# Patient Record
Sex: Male | Born: 1990 | Race: White | Hispanic: No | Marital: Single
Health system: Southern US, Community
[De-identification: ages and names within clinical notes are randomized; demographics above are authoritative.]

## PROBLEM LIST (undated history)

## (undated) DIAGNOSIS — J45909 Unspecified asthma, uncomplicated: Secondary | ICD-10-CM

## (undated) HISTORY — PX: CHOLECYSTECTOMY: SHX55

---

## 2015-02-04 ENCOUNTER — Emergency Department (HOSPITAL_BASED_OUTPATIENT_CLINIC_OR_DEPARTMENT_OTHER)
Admission: EM | Admit: 2015-02-04 | Discharge: 2015-02-04 | Disposition: A | Discharge status: Home / Self Care | Payer: Self-pay | Attending: Emergency Medicine | Admitting: Emergency Medicine

## 2015-02-04 ENCOUNTER — Encounter (HOSPITAL_BASED_OUTPATIENT_CLINIC_OR_DEPARTMENT_OTHER): Payer: Self-pay | Admitting: *Deleted

## 2015-02-04 DIAGNOSIS — K0889 Other specified disorders of teeth and supporting structures: Secondary | ICD-10-CM

## 2015-02-04 DIAGNOSIS — K088 Other specified disorders of teeth and supporting structures: Secondary | ICD-10-CM | POA: Insufficient documentation

## 2015-02-04 DIAGNOSIS — K029 Dental caries, unspecified: Secondary | ICD-10-CM | POA: Insufficient documentation

## 2015-02-04 DIAGNOSIS — J45909 Unspecified asthma, uncomplicated: Secondary | ICD-10-CM | POA: Insufficient documentation

## 2015-02-04 HISTORY — DX: Unspecified asthma, uncomplicated: J45.909

## 2015-02-04 MED ORDER — IBUPROFEN 600 MG PO TABS: 600.0000 mg | ORAL_TABLET | Freq: Four times a day (QID) | ORAL | Status: AC | PRN

## 2015-02-04 MED ORDER — IBUPROFEN 200 MG PO TABS
600.0000 mg | ORAL_TABLET | Freq: Once | ORAL | Status: AC
  Administered 2015-02-04: 600 mg via ORAL
  Filled 2015-02-04 (×2): qty 1

## 2015-02-04 MED ORDER — PENICILLIN V POTASSIUM 500 MG PO TABS: 500.0000 mg | ORAL_TABLET | Freq: Four times a day (QID) | ORAL | Status: AC

## 2015-02-04 NOTE — ED Notes (Signed)
MD at bedside. 

## 2015-02-04 NOTE — Discharge Instructions (Signed)
Dental Pain °A tooth ache may be caused by cavities (tooth decay). Cavities expose the nerve of the tooth to air and hot or cold temperatures. It may come from an infection or abscess (also called a boil or furuncle) around your tooth. It is also often caused by dental caries (tooth decay). This causes the pain you are having. °DIAGNOSIS  °Your caregiver can diagnose this problem by exam. °TREATMENT  °· If caused by an infection, it may be treated with medications which kill germs (antibiotics) and pain medications as prescribed by your caregiver. Take medications as directed. °· Only take over-the-counter or prescription medicines for pain, discomfort, or fever as directed by your caregiver. °· Whether the tooth ache today is caused by infection or dental disease, you should see your dentist as soon as possible for further care. °SEEK MEDICAL CARE IF: °The exam and treatment you received today has been provided on an emergency basis only. This is not a substitute for complete medical or dental care. If your problem worsens or new problems (symptoms) appear, and you are unable to meet with your dentist, call or return to this location. °SEEK IMMEDIATE MEDICAL CARE IF:  °· You have a fever. °· You develop redness and swelling of your face, jaw, or neck. °· You are unable to open your mouth. °· You have severe pain uncontrolled by pain medicine. °MAKE SURE YOU:  °· Understand these instructions. °· Will watch your condition. °· Will get help right away if you are not doing well or get worse. °Document Released: 07/09/2005 Document Revised: 10/01/2011 Document Reviewed: 02/25/2008 °ExitCare® Patient Information ©2015 [employer]. This information is not intended to replace advice given to you by your health care provider. Make sure you discuss any questions you have with your health care provider. ° °Emergency Department Resource Guide °1) Find a Doctor and Pay Out of Pocket °Although you won't have to find out who  is covered by your insurance plan, it is a good idea to ask around and get recommendations. You will then need to call the office and see if the doctor you have chosen will accept you as a new patient and what types of options they offer for patients who are self-pay. Some doctors offer discounts or will set up payment plans for their patients who do not have insurance, but you will need to ask so you aren't surprised when you get to your appointment. ° °2) Contact Your Local Health Department °Not all health departments have doctors that can see patients for sick visits, but many do, so it is worth a call to see if yours does. If you don't know where your local health department is, you can check in your phone book. The CDC also has a tool to help you locate your state's health department, and many state websites also have listings of all of their local health departments. ° °3) Find a Walk-in Clinic °If your illness is not likely to be very severe or complicated, you may want to try a walk in clinic. These are popping up all over the country in pharmacies, drugstores, and shopping centers. They're usually staffed by nurse practitioners or physician assistants that have been trained to treat common illnesses and complaints. They're usually fairly quick and inexpensive. However, if you have serious medical issues or chronic medical problems, these are probably not your best option. ° °No Primary Care Doctor: °- Call [facility] at  832-8000 - they can help you locate a primary   care doctor that  accepts your insurance, provides certain services, etc. °- Physician Referral Service- 1-800-533-3463 ° °Chronic Pain Problems: °Organization         Address  Phone   Notes  °[facility]  (336) 297-2271 Patients need to be referred by their primary care doctor.  ° °Medication Assistance: °Organization         Address  Phone   Notes  °[place] [facility] [address]  [address] °[place], [place] [address] (336) 641-8030 --Must be a resident of [place] °-- Must have NO insurance coverage whatsoever (no Medicaid/ Medicare, etc.) °-- The pt. MUST have a primary care doctor that directs their care regularly and follows them in the community °  °[facility]  (866) 331-1348   °[employer]  (888) 892-1162   ° °Agencies that provide inexpensive medical care: °Organization         Address  Phone   Notes  °[facility]  (336) 832-8035   °[facility]    (336) 832-7272   °[facility] [address] °[place], [place] [address] (336) 832-4777   °[facility] [address], °[place] (336) 271-4999   °[facility]    (336) 373-0678   °[facility]    (336) 272-1050   °[facility] ° [address], [place] Phone:  (336) 832-4444, Fax:  (336) 832-4440 Hours of Operation:  9 am - 6 pm, M-F.  Also accepts Medicaid/Medicare and self-pay.  °[facility] ° [address], [place] Phone: (336) 832-3150, Fax: (336) 832-3151. Hours of Operation:  8:30 am - 5:30 pm, M-F.  Also accepts Medicaid and self-pay.  °[facility] [place] [address], [place] Phone: (336) 878-6027   °[facility] [address], [place], [place] (336)723-1848, Ext. 123 Mondays & Thursdays: 7-9 AM.  First 15 patients are seen on a first come, first serve basis. °  ° °Medicaid-accepting [place] Providers: ° °Organization         Address  Phone   Notes  °[facility] [address], [place] (336) 641-2100 Also accepts self-pay patients.  °[facility] [address], [place] ° (336) 856-9996   °[facility] [address], [place] (336) 288-8857   °[facility] [address] [place] [address], [place] (336) 299-7000   °Veita Bland [address], [place]  ° (336) 373-1557 Only accepts [place] Access Medicaid patients after they have their name applied to their card.  ° °Self-Pay (no insurance) in [place]: ° °Organization         Address  Phone   Notes  °Sickle Cell Patients, [facility] [address], [place] (336) 832-1970   °[facility] [address], [place] (336) 832-4400   °[facility] ° [address], [place] (336) 992-4800   °[facility]/Dr. Osei-Bonsu ° [address] [place] [address], [place] or [address], [place] (336) 841-8500 Phone number for both [place] and [place] locations is the same.  °[facility] [address], [place] (336) 299-0000   °[facility] [address] [place] [address],  or [address] (336) 852-7530 °(336) 878-2260   °  [facility] [address], [place] (336) 350-1642, phone; (336) 294-5005, fax Sees patients 1st and 3rd Saturday of every month.  Must not qualify for public or private insurance (i.e. Medicaid, Medicare, [place] Health Choice, Veterans' Benefits) • Household income should be no more than 200% of the poverty level •The clinic cannot treat you if you are pregnant or think you are pregnant • Sexually transmitted diseases are not treated at the clinic.  ° ° °Dental Care: °Organization         Address  Phone  Notes  °[facility] [facility] [address], [place] (336) 641-6152 Accepts children up to age 21 who are enrolled in Medicaid or [place] Health Choice; pregnant women with a Medicaid card; and children who have applied for Medicaid or [place] Health Choice, but were declined, whose parents can pay a reduced fee at time of service.  °[facility] [place]  [address], [place] (336) 641-7733 Accepts children up to age 21 who are  enrolled in Medicaid or [place] Health Choice; pregnant women with a Medicaid card; and children who have applied for Medicaid or [place] Health Choice, but were declined, whose parents can pay a reduced fee at time of service.  °[facility] ° [address], [place] (336) 641-4533 Patients are seen by appointment only. Walk-ins are not accepted. [facility] will see patients 18 years of age and older. °Monday - Tuesday (8am-5pm) °Most Wednesdays (8:30-5pm) °$30 per visit, cash only  °[facility] ° [address], [place] (336) 641-4533 Patients are seen by appointment only. Walk-ins are not accepted. [facility] will see patients 18 years of age and older. °One Wednesday Evening (Monthly: Volunteer Based).  $30 per visit, cash only  °[facility]  (919) 537-3737 for adults; Children under age 4, call Graduate Pediatric Dentistry at (919) [phone]. Children aged 4-14, please call (919) [phone] to request a pediatric application. ° Dental services are provided in all areas of dental care including fillings, crowns and bridges, complete and partial dentures, implants, gum treatment, root canals, and extractions. Preventive care is also provided. Treatment is provided to both adults and children. °Patients are selected via a lottery and there is often a waiting list. °  °[facility] [address], °[place] ° (336) 763-8833 [email or web address] °  °[facility] [address], [place], [place] (336)723-1848, Ext. 123 Second and Fourth Thursday of each month, opens at 6:30 AM; Clinic ends at 9 AM.  Patients are seen on a first-come first-served basis, and a limited number are seen during each clinic.  ° °[facility] ° [address], [place],  (336) 723-7904   Eligibility Requirements °You must have lived in [place], [place], or [place] counties for at least the last three months. °  You  cannot be eligible for state or federal sponsored healthcare insurance, including Veterans Administration, Medicaid, or Medicare. °  You generally cannot be eligible for healthcare insurance through your employer.  °  How to apply: °Eligibility screenings are held every Tuesday and Wednesday afternoon from 1:00 pm until 4:00 pm. You do not need an appointment for the interview!  °[facility] [address], [place],  336-631-2330   °[facility] [facility]  336-342-8273   °[facility]  336-703-3100   °[facility]  336-570-6415   ° °Behavioral Health Resources in the Community: °Intensive Outpatient Programs °Organization         Address  Phone  Notes  °[facility] [address], [place], [place] 336-878-6098   °[facility] [address], [place], [place] 336-832-9800   °[facility] [address], [place], [place] ° 336-882-2125   °[facility] [address],  °[place], [place] 1-800-853-5163 or 336-641-4981   °Substance Abuse Resources °Organization         Address  Phone  Notes  °[facility]  336-882-2125   °[facility]  336-784-9470   °[facility]  336-285-9073   °[facility]  336-845-3988   °Residential & Outpatient Substance Abuse Program  1-800-659-3381   °Psychological Services °Organization         Address  Phone  Notes  °[facility]  336- 832-9600   °[facility]  336- 378-7881   °[facility] [address], [place] 1-800-853-5163 or 336-641-4981   ° °Mobile Crisis Teams °Organization         Address  Phone  Notes  °[facility], Mobile Crisis Care Unit  1-877-626-1772   °Assertive °[facility] ° [address] [place], [place] 336-834-9664   °Sharon DeEsch [address] °[place] 336-554-5454   ° °Self-Help/Support  Groups °Organization         Address  Phone             Notes  °[facility] [place] - variety of support groups  336- 373-1402 Call for more information  °Narcotics Anonymous (NA), [facility] [address], °[place]  2 meetings at this location  ° °Residential Treatment Programs °Organization         Address  Phone  Notes  °[facility] [address],    °[place]  1-866-801-8205   °[facility] ° [address], [place], [place] 704-293-8524   °[facility] [address], [place] 336-845-3988 Admissions: 8am-3pm M-F  °[facility] [address]    °[place], [place] 336-841-1104   °[facility] [address], [place], [place] 336-379-7146   °[facility] [address]  °[place], [place] 336-285-9073   °[facility] - Intensive Outpatient [address], [place], [place] 336-852-3033   °[facility] [address]  °[place], [place] 1-877-615-2722 or 336-784-9470   °[facility] [address] [place], [place] 336-227-7417 Accepts Medicaid  °[facility] [address]  ° [place] 1-800-659-3381 Substance Abuse/Addiction Treatment  ° °[place] Behavioral Health Resources °Organization         Address  Phone  Notes  °[facility]  (888) 581-9988   °Julie Brannon, PhD [address] [place], [place]   (336) 349-5553 or (336) 951-0000   °[facility]   [address] °[place], [place] (336) 349-4454   °[facility] [address], [place], [place] (336) 342-8316 Insurance/Medicaid/sponsorship through [facility]  °[facility] [address]                                    [place], [place] (336) 342-8316 Therapy/tele-psych/case  °[facility]   [address]  ° [place], [place] (336) 349-2233    °Dr. Arfeen  (336) 349-4544   °[facility] [place]  [employer] [facility] [place] [facility] 1) [address], [place] °2) [address], [place] °3)  [address], [place] (336) 349-3220 °(336) 342-7768 ° °(336) 342-8140   °[place] [facility] (336) 342-1394 or (336) 342-3537 (After Hours)    ° ° ° °

## 2015-02-04 NOTE — ED Notes (Signed)
Pt/ family provided contact information for dental clinics/ services

## 2015-02-04 NOTE — ED Notes (Signed)
DC instructions reviewed with pt and brother, discussed pain management and utilizing Ibuprofen also importance of taking all of ABX prescribed by EDP, provided list of free dental services in area.

## 2015-02-04 NOTE — ED Notes (Signed)
C/o lower right side toothache x 2 weeks. Abscess has ruptures several times.

## 2015-02-04 NOTE — ED Provider Notes (Signed)
CSN: [id number]     Arrival date & time 02/04/15  0801 History   First MD Initiated Contact with Patient 02/04/15 [phone]     No chief complaint on file.    (Consider location/radiation/quality/duration/timing/severity/associated sxs/prior Treatment) Patient is a 24 y.o. male presenting with tooth pain. The history is provided by the patient.  Dental Pain Location:  Lower Lower teeth location:  30/RL 1st molar Quality:  Throbbing Severity:  Mild Onset quality:  Sudden Duration: 30 minutes. Timing:  Sporadic Progression:  Unchanged Chronicity:  Recurrent Context: poor dentition   Relieved by:  Nothing Worsened by:  Nothing tried Associated symptoms: no fever     Past Medical History  Diagnosis Date  . Asthma    History reviewed. No pertinent past surgical history. No family history on file. History  Substance Use Topics  . Smoking status: Passive Smoke Exposure - Never Smoker  . Smokeless tobacco: Not on file  . Alcohol Use: Not on file    Review of Systems  Constitutional: Negative for fever and chills.  Respiratory: Negative for cough and shortness of breath.   Gastrointestinal: Negative for vomiting and abdominal pain.  All other systems reviewed and are negative.     Allergies  Morphine and related  Home Medications   Prior to Admission medications   Medication Sig Start Date End Date Taking? Authorizing Provider  ibuprofen (ADVIL,MOTRIN) 600 MG tablet Take 1 tablet (600 mg total) by mouth every 6 (six) hours as needed. 02/04/15   Elwin MochaBlair Jil Penland, MD  ibuprofen (ADVIL,MOTRIN) 600 MG tablet Take 1 tablet (600 mg total) by mouth every 6 (six) hours as needed. 02/04/15   Elwin MochaBlair Avant Printy, MD  penicillin v potassium (VEETID) 500 MG tablet Take 1 tablet (500 mg total) by mouth 4 (four) times daily. 02/04/15 02/11/15  Elwin MochaBlair Kerigan Narvaez, MD   BP 109/77 mmHg  Pulse 80  Temp(Src) 97.8 F (36.6 C) (Oral)  Resp 18  Ht 5\' 4"  (1.626 m)  Wt 114 lb (51.71 kg)  BMI 19.56 kg/m2  SpO2  100% Physical Exam  Constitutional: He is oriented to person, place, and time. He appears well-developed and well-nourished. No distress.  HENT:  Head: Normocephalic and atraumatic.  Mouth/Throat: No oropharyngeal exudate.    Eyes: EOM are normal. Pupils are equal, round, and reactive to light.  Neck: Normal range of motion. Neck supple.  Cardiovascular: Normal rate and regular rhythm.  Exam reveals no friction rub.   No murmur heard. Pulmonary/Chest: Effort normal and breath sounds normal. No respiratory distress. He has no wheezes. He has no rales.  Abdominal: He exhibits no distension. There is no tenderness. There is no rebound.  Musculoskeletal: Normal range of motion. He exhibits no edema.  Neurological: He is alert and oriented to person, place, and time.  Skin: Skin is warm. No rash noted. He is not diaphoretic.  Nursing note and vitals reviewed.   ED Course  Procedures (including critical care time) Labs Review Labs Reviewed - No data to display  Imaging Review No results found.   EKG Interpretation None      MDM   Final diagnoses:  Pain, dental    24 year old male here with right lower dental pain. Intermittent. No pain at this time. No fever or difficulty breathing. Right first molar on the mandible shows full dental cavity. Tooth is almost hollowed out. No abscess identified. Will give Motrin, penicillin, dental follow-up. No stridor critical to breathing, airway compromise. He is well appearing. No signs of outward infection.  No cellulitis or abscess amenable to drainage.    [place]win Mocha, MD 02/04/15 [phone]

## 2015-02-04 NOTE — ED Notes (Signed)
C/o rt lower tooth ache, onset at approx 0300hrs today, has taken no medication, states having dental issues on and off for the past several months.

## 2019-10-05 ENCOUNTER — Emergency Department (HOSPITAL_BASED_OUTPATIENT_CLINIC_OR_DEPARTMENT_OTHER)
Admission: EM | Admit: 2019-10-05 | Discharge: 2019-10-05 | Disposition: A | Discharge status: Home / Self Care | Payer: No Typology Code available for payment source | Attending: Emergency Medicine | Admitting: Emergency Medicine

## 2019-10-05 ENCOUNTER — Encounter (HOSPITAL_BASED_OUTPATIENT_CLINIC_OR_DEPARTMENT_OTHER): Payer: Self-pay | Admitting: Emergency Medicine

## 2019-10-05 ENCOUNTER — Other Ambulatory Visit: Payer: Self-pay

## 2019-10-05 ENCOUNTER — Emergency Department (HOSPITAL_BASED_OUTPATIENT_CLINIC_OR_DEPARTMENT_OTHER): Payer: Worker's Compensation

## 2019-10-05 DIAGNOSIS — W278XXA Contact with other nonpowered hand tool, initial encounter: Secondary | ICD-10-CM | POA: Insufficient documentation

## 2019-10-05 DIAGNOSIS — S61249A Puncture wound with foreign body of unspecified finger without damage to nail, initial encounter: Secondary | ICD-10-CM

## 2019-10-05 DIAGNOSIS — J45909 Unspecified asthma, uncomplicated: Secondary | ICD-10-CM | POA: Diagnosis not present

## 2019-10-05 DIAGNOSIS — Z885 Allergy status to narcotic agent status: Secondary | ICD-10-CM | POA: Diagnosis not present

## 2019-10-05 DIAGNOSIS — Y990 Civilian activity done for income or pay: Secondary | ICD-10-CM | POA: Diagnosis not present

## 2019-10-05 DIAGNOSIS — Y9389 Activity, other specified: Secondary | ICD-10-CM | POA: Insufficient documentation

## 2019-10-05 DIAGNOSIS — Z880 Allergy status to penicillin: Secondary | ICD-10-CM | POA: Diagnosis not present

## 2019-10-05 DIAGNOSIS — S61341A Puncture wound with foreign body of left index finger with damage to nail, initial encounter: Secondary | ICD-10-CM | POA: Diagnosis present

## 2019-10-05 DIAGNOSIS — Y929 Unspecified place or not applicable: Secondary | ICD-10-CM | POA: Insufficient documentation

## 2019-10-05 DIAGNOSIS — Z23 Encounter for immunization: Secondary | ICD-10-CM | POA: Insufficient documentation

## 2019-10-05 DIAGNOSIS — S62639A Displaced fracture of distal phalanx of unspecified finger, initial encounter for closed fracture: Secondary | ICD-10-CM

## 2019-10-05 DIAGNOSIS — S62631A Displaced fracture of distal phalanx of left index finger, initial encounter for closed fracture: Secondary | ICD-10-CM | POA: Diagnosis not present

## 2019-10-05 MED ORDER — TETANUS-DIPHTH-ACELL PERTUSSIS 5-2.5-18.5 LF-MCG/0.5 IM SUSP
0.5000 mL | Freq: Once | INTRAMUSCULAR | Status: AC
  Administered 2019-10-05: 0.5 mL via INTRAMUSCULAR
  Filled 2019-10-05: qty 0.5

## 2019-10-05 MED ORDER — BUPIVACAINE HCL (PF) 0.5 % IJ SOLN
10.0000 mL | Freq: Once | INTRAMUSCULAR | Status: AC
  Administered 2019-10-05: 10 mL
  Filled 2019-10-05: qty 10

## 2019-10-05 MED ORDER — LIDOCAINE HCL (PF) 1 % IJ SOLN
INTRAMUSCULAR | Status: AC
  Filled 2019-10-05: qty 5

## 2019-10-05 MED ORDER — DOXYCYCLINE HYCLATE 100 MG PO CAPS: 100.0000 mg | ORAL_CAPSULE | Freq: Two times a day (BID) | ORAL | 0 refills | Status: AC

## 2019-10-05 NOTE — ED Provider Notes (Signed)
[facility] EMERGENCY DEPARTMENT Provider Note   CSN: [id number] Arrival date & time: 10/05/19  1452     History Chief Complaint  Patient presents with  . Finger Injury    Tayton Decaire is a 29 y.o. male.  HPI      Kary Colaizzi is a 29 y.o. male, with a history of asthma, presenting to the ED with left index finger injury that occurred shortly prior to arrival.  Patient was using a pneumatic stapler and stapled his finger accidentally.  His pain is moderate, throbbing, nonradiating. He is not up-to-date on tetanus. Denies numbness, other injuries.   Past Medical History:  Diagnosis Date  . Asthma     There are no problems to display for this patient.   Past Surgical History:  Procedure Laterality Date  . CHOLECYSTECTOMY         No family history on file.  Social History   Tobacco Use  . Smoking status: Passive Smoke Exposure - Never Smoker  . Smokeless tobacco: Never Used  Substance Use Topics  . Alcohol use: Yes  . Drug use: Not on file    Home Medications Prior to Admission medications   Medication Sig Start Date End Date Taking? Authorizing Provider  doxycycline (VIBRAMYCIN) 100 MG capsule Take 1 capsule (100 mg total) by mouth 2 (two) times daily for 5 days. 10/05/19 10/10/19  Dalia Jollie C, PA-C  ibuprofen (ADVIL,MOTRIN) 600 MG tablet Take 1 tablet (600 mg total) by mouth every 6 (six) hours as needed. 02/04/15   Elwin Mocha, MD  ibuprofen (ADVIL,MOTRIN) 600 MG tablet Take 1 tablet (600 mg total) by mouth every 6 (six) hours as needed. 02/04/15   Elwin Mocha, MD    Allergies    Morphine and related and Penicillins  Review of Systems   Review of Systems  Skin: Positive for wound.  Neurological: Negative for numbness.    Physical Exam Updated Vital Signs BP (!) 155/89 (BP Location: Right Arm)   Pulse 78   Temp 99.3 F (37.4 C) (Oral)   Resp 18   Ht 5\' 4"  (1.626 m)   Wt 72.6 kg   SpO2 100%   BMI 27.46 kg/m   Physical Exam Vitals  and nursing note reviewed.  Constitutional:      General: He is not in acute distress.    Appearance: He is well-developed. He is not diaphoretic.  HENT:     Head: Normocephalic and atraumatic.  Eyes:     Conjunctiva/sclera: Conjunctivae normal.  Cardiovascular:     Rate and Rhythm: Normal rate and regular rhythm.  Pulmonary:     Effort: Pulmonary effort is normal.  Musculoskeletal:     Cervical back: Neck supple.     Comments: There is a large staple entering through the palmar side of the dorsal left index finger and exiting through the distal end of the finger.  Skin:    General: Skin is warm and dry.     Capillary Refill: Capillary refill takes less than 2 seconds.     Coloration: Skin is not pale.  Neurological:     Mental Status: He is alert.     Comments: Sensation to light touch grossly intact in the left index finger. Motor function intact in left index finger.  Psychiatric:        Behavior: Behavior normal.            ED Results / Procedures / Treatments   Labs (all labs ordered are listed,  but only abnormal results are displayed) Labs Reviewed - No data to display  EKG None  Radiology DG Finger Index Left  Result Date: 10/05/2019 CLINICAL DATA:  Staple in left second finger EXAM: LEFT INDEX FINGER 2+V COMPARISON:  None. FINDINGS: A metallic density staple projects on the left second finger tip with soft tissue laceration and small cortical avulsion from the left distal phalanx tuft best seen in the lateral view. There is no dislocation. The joint spaces and bone mineralization are normal. IMPRESSION: Metallic staple projects on the left second finger tip with soft tissue laceration and small cortical avulsion from the left second distal phalanx tuft. Electronically Signed   By: Revonda Humphrey   On: 10/05/2019 16:26    Procedures .Nerve Block  Date/Time: 10/05/2019 3:53 PM Performed by: Lorayne Bender, PA-C Authorized by: Lorayne Bender, PA-C   Consent:     Consent obtained:  Verbal   Consent given by:  Patient   Risks discussed:  Pain, swelling, unsuccessful block, infection and bleeding Location:    Body area:  Upper extremity   Upper extremity nerve blocked: Index finger; digital block.   Laterality:  Left Pre-procedure details:    Skin preparation:  Alcohol Procedure details (see MAR for exact dosages):    Block needle gauge:  25 G   Anesthetic injected:  Bupivacaine 0.5% w/o epi   Injection procedure:  Anatomic landmarks identified, anatomic landmarks palpated, incremental injection, introduced needle and negative aspiration for blood Post-procedure details:    Outcome:  Anesthesia achieved   Patient tolerance of procedure:  Tolerated well, no immediate complications .Foreign Body Removal  Date/Time: 10/05/2019 5:15 PM Performed by: Lorayne Bender, PA-C Authorized by: Lorayne Bender, PA-C  Consent: Verbal consent obtained. Risks and benefits: risks, benefits and alternatives were discussed Consent given by: patient Patient understanding: patient states understanding of the procedure being performed Imaging studies: imaging studies available Patient identity confirmed: verbally with patient and provided demographic data Body area: skin General location: upper extremity Location details: left index finger Anesthesia: digital block  Anesthesia: Local Anesthetic: bupivacaine 0.5% without epinephrine  Sedation: Patient sedated: no  Patient restrained: no Patient cooperative: yes Localization method: visualized Removal mechanism: hemostat Depth: deep Complexity: complex 1 objects recovered. Objects recovered: Staple Post-procedure assessment: foreign body removed Patient tolerance: patient tolerated the procedure well with no immediate complications   (including critical care time)  Medications Ordered in ED Medications  bupivacaine (MARCAINE) 0.5 % injection 10 mL (10 mLs Infiltration Given 10/05/19 1557)  Tdap (BOOSTRIX)  injection 0.5 mL (0.5 mLs Intramuscular Given 10/05/19 1600)    ED Course  I have reviewed the triage vital signs and the nursing notes.  Pertinent labs & imaging results that were available during my care of the patient were reviewed by me and considered in my medical decision making (see chart for details).  Clinical Course as of Oct 05 1811  Mon Oct 05, 2019  1652 Spoke with Dr. Lenon Curt. Have the patient squeeze and manipulate the wound throughout the day for the next several days to keep the wound open to ensure or any trapped bacteria inside has a chance to drain out.  Oral ABX. Office follow up as needed.   [SJ]    Clinical Course User Index [SJ] Ivannah Zody, Helane Gunther, PA-C   MDM Rules/Calculators/A&P                      Patient presents with puncture wound with staple  foreign body to the finger. X-ray shows tuft fracture. Foreign body removed successfully. The patient was given instructions for home care as well as return precautions. Patient voices understanding of these instructions, accepts the plan, and is comfortable with discharge.  I reviewed and interpreted the patient's radiological studies.    Final Clinical Impression(s) / ED Diagnoses Final diagnoses:  Puncture wound of finger with foreign body  Closed fracture of tuft of distal phalanx of finger    Rx / DC Orders ED Discharge Orders         Ordered    doxycycline (VIBRAMYCIN) 100 MG capsule  2 times daily     10/05/19 1739           Anselm Pancoast, PA-C 10/05/19 1815    Milagros Loll, MD 10/06/19 2358

## 2019-10-05 NOTE — Discharge Instructions (Addendum)
  Wound Care - Laceration You may remove the bandage after 12 hours. Clean the wound and surrounding area gently with tap water and mild soap. Rinse well and blot dry. Do not scrub the wound, as this may cause the wound edges to come apart. You may shower, but avoid submerging the wound, such as with a bath or swimming. Clean the wound daily to prevent infection.  Be sure to squeeze and express blood from the wound for the first few days in order to attempt to clear out any harbored bacteria.  Please take all of your antibiotics until finished!   You may develop abdominal discomfort or diarrhea from the antibiotic.  You may help offset this with probiotics which you can buy or get in yogurt. Do not eat or take the probiotics until 2 hours after your antibiotic.   Scar reduction: Application of a topical antibiotic ointment, such as Neosporin, after the wound has begun to close and heal well can decrease scab formation and reduce scarring. After the wound has healed, application of ointments such as Aquaphor can also reduce scar formation.  The key to scar reduction is keeping the skin well hydrated and supple. Drinking plenty of water throughout the day (At least eight 8oz glasses of water a day) is essential to staying well hydrated.  Sun exposure: Keep the wound out of the sun. After the wound has healed, continue to protect it from the sun by wearing protective clothing or applying sunscreen.  Pain: You may use Tylenol, naproxen, or ibuprofen for pain.  Return to the ED signs of infection arise, such as spreading redness, puffiness/swelling, pus draining from the wound, severe increase in pain, fever over 100.40F, or any other major issues.  For prescription assistance, may try using prescription discount sites or apps, such as goodrx.com

## 2019-10-05 NOTE — ED Triage Notes (Signed)
He stapled his L index finger to a piece of cardboard with a staple gun

## 2021-02-24 IMAGING — DX DG FINGER INDEX 2+V*L*
4 series · 4 of 4 positions shown · non-contrast
Comparison: None.

CLINICAL DATA: Staple in left second finger

EXAM:
LEFT INDEX FINGER 2+V

[finger ap]
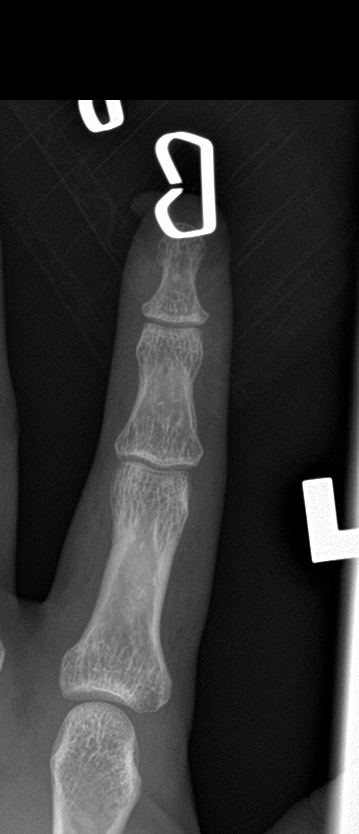

[finger obl (1 of 2)]
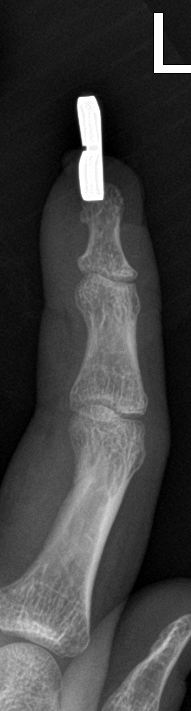

[finger obl (2 of 2)]
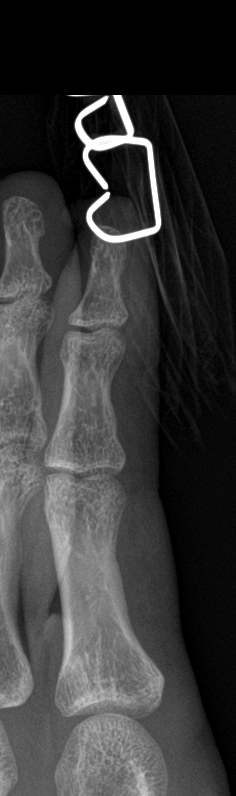

[finger lat]
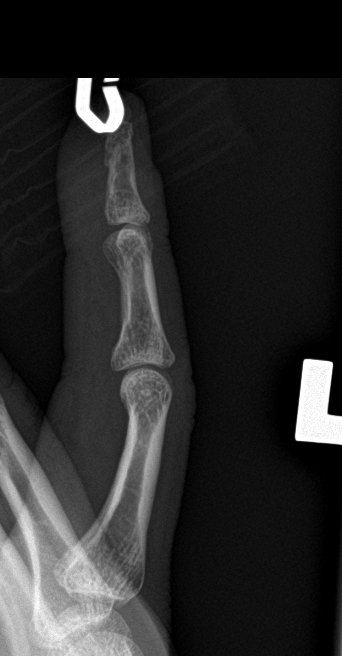

[4 of 4 positions shown; findings below may reference images not displayed]

FINDINGS: A metallic density staple projects on the left second finger tip
with soft tissue laceration and small cortical avulsion from the
left distal phalanx tuft best seen in the lateral view. There is no
dislocation. The joint spaces and bone mineralization are normal.
IMPRESSION: Metallic staple projects on the left second finger tip with soft
tissue laceration and small cortical avulsion from the left second
distal phalanx tuft.
